# Patient Record
Sex: Female | Born: 1965
Health system: Southern US, Community
[De-identification: ages and names within clinical notes are randomized; demographics above are authoritative.]

---

## 1999-07-10 ENCOUNTER — Other Ambulatory Visit: Admission: RE | Admit: 1999-07-10 | Discharge: 1999-07-10 | Payer: Self-pay | Admitting: Obstetrics and Gynecology

## 2000-09-22 ENCOUNTER — Other Ambulatory Visit: Admission: RE | Admit: 2000-09-22 | Discharge: 2000-09-22 | Payer: Self-pay | Admitting: Obstetrics and Gynecology

## 2001-09-27 ENCOUNTER — Other Ambulatory Visit: Admission: RE | Admit: 2001-09-27 | Discharge: 2001-09-27 | Payer: Self-pay | Admitting: Obstetrics and Gynecology

## 2002-07-29 ENCOUNTER — Other Ambulatory Visit: Admission: RE | Admit: 2002-07-29 | Discharge: 2002-07-29 | Payer: Self-pay | Admitting: Obstetrics and Gynecology

## 2003-08-25 ENCOUNTER — Other Ambulatory Visit: Admission: RE | Admit: 2003-08-25 | Discharge: 2003-08-25 | Payer: Self-pay | Admitting: Obstetrics and Gynecology

## 2004-01-17 ENCOUNTER — Ambulatory Visit (HOSPITAL_COMMUNITY): Admission: RE | Admit: 2004-01-17 | Discharge: 2004-01-17 | Payer: Self-pay

## 2004-01-17 ENCOUNTER — Encounter (INDEPENDENT_AMBULATORY_CARE_PROVIDER_SITE_OTHER): Payer: Self-pay | Admitting: Specialist

## 2004-12-06 ENCOUNTER — Other Ambulatory Visit: Admission: RE | Admit: 2004-12-06 | Discharge: 2004-12-06 | Payer: Self-pay | Admitting: Obstetrics and Gynecology

## 2007-12-02 ENCOUNTER — Encounter (INDEPENDENT_AMBULATORY_CARE_PROVIDER_SITE_OTHER): Payer: Self-pay | Admitting: Obstetrics and Gynecology

## 2007-12-02 ENCOUNTER — Ambulatory Visit (HOSPITAL_COMMUNITY): Admission: RE | Admit: 2007-12-02 | Discharge: 2007-12-02 | Payer: Self-pay | Admitting: Obstetrics and Gynecology

## 2008-02-28 ENCOUNTER — Ambulatory Visit (HOSPITAL_COMMUNITY): Admission: RE | Admit: 2008-02-28 | Discharge: 2008-02-28 | Payer: Self-pay | Admitting: Urology

## 2009-02-26 ENCOUNTER — Encounter: Admission: RE | Admit: 2009-02-26 | Discharge: 2009-02-26 | Payer: Self-pay | Admitting: Obstetrics and Gynecology

## 2009-02-26 IMAGING — MG MM DIGITAL DIAGNOSTIC UNILAT R
2 series · 2 of 2 positions shown · non-contrast
Comparison: None

CLINICAL DATA: Abnormal baseline screening mammogram

DIGITAL DIAGNOSTIC RIGHT MAMMOGRAM

[R CC]
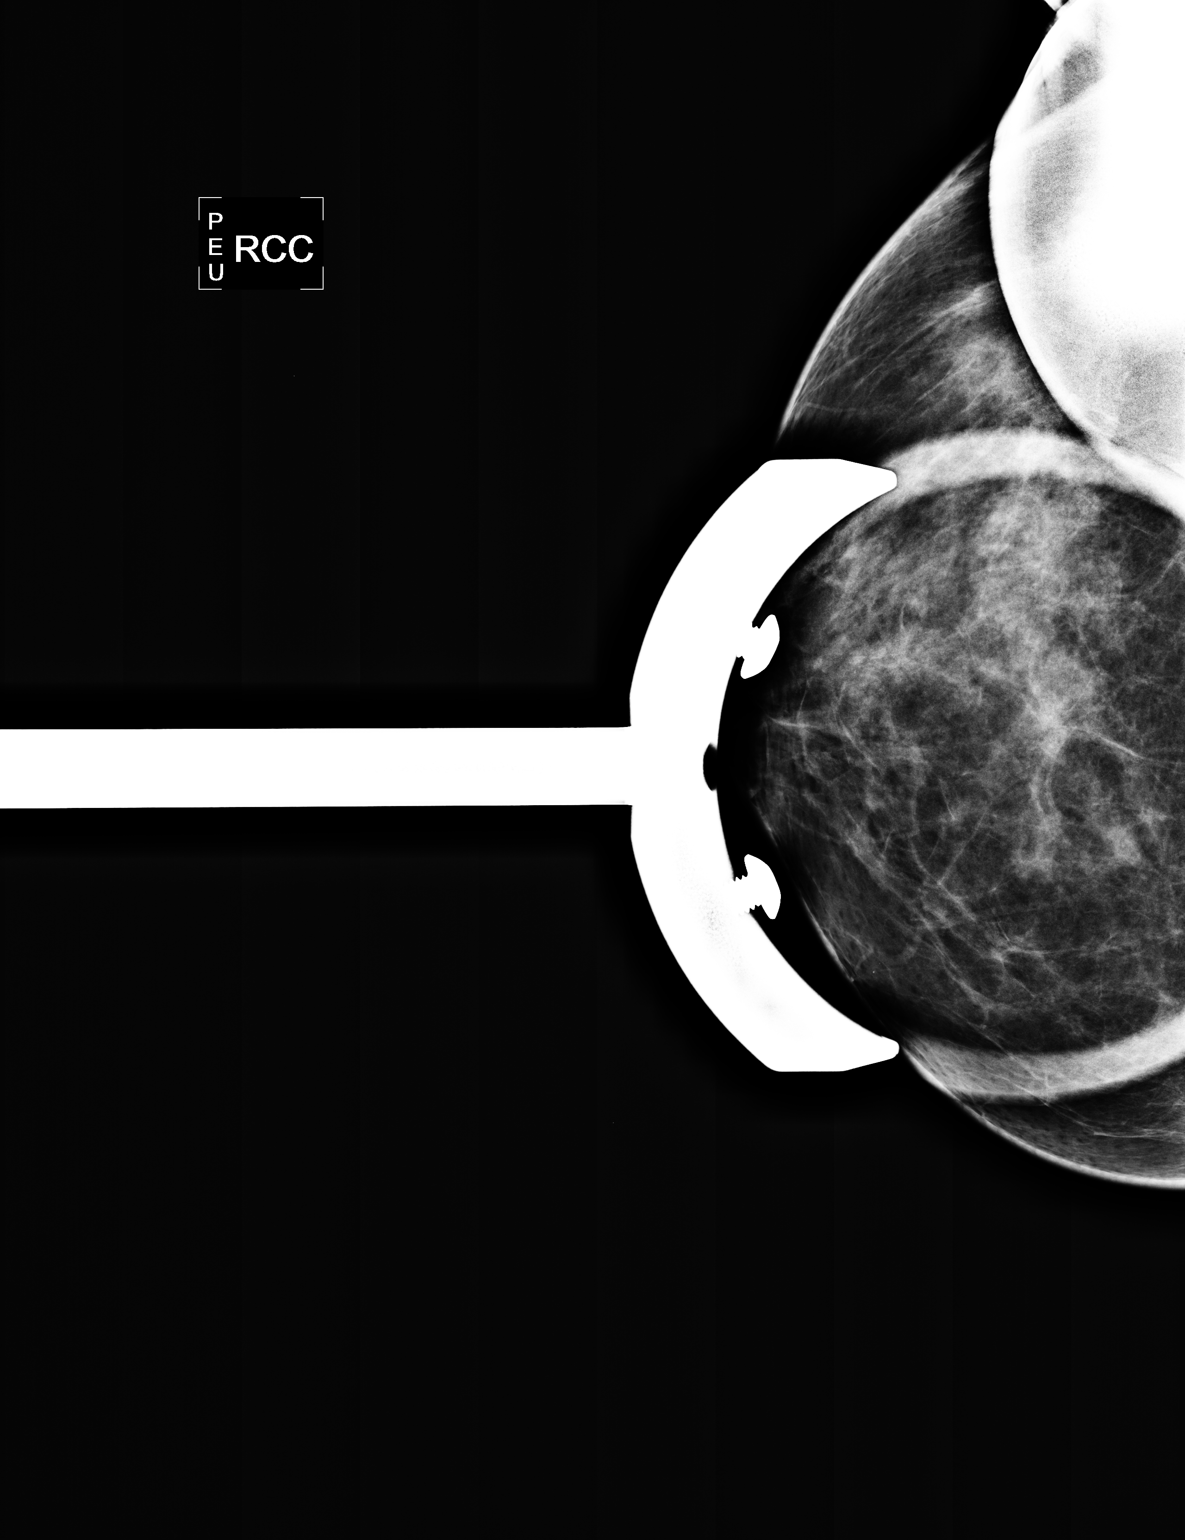

[R MLO]
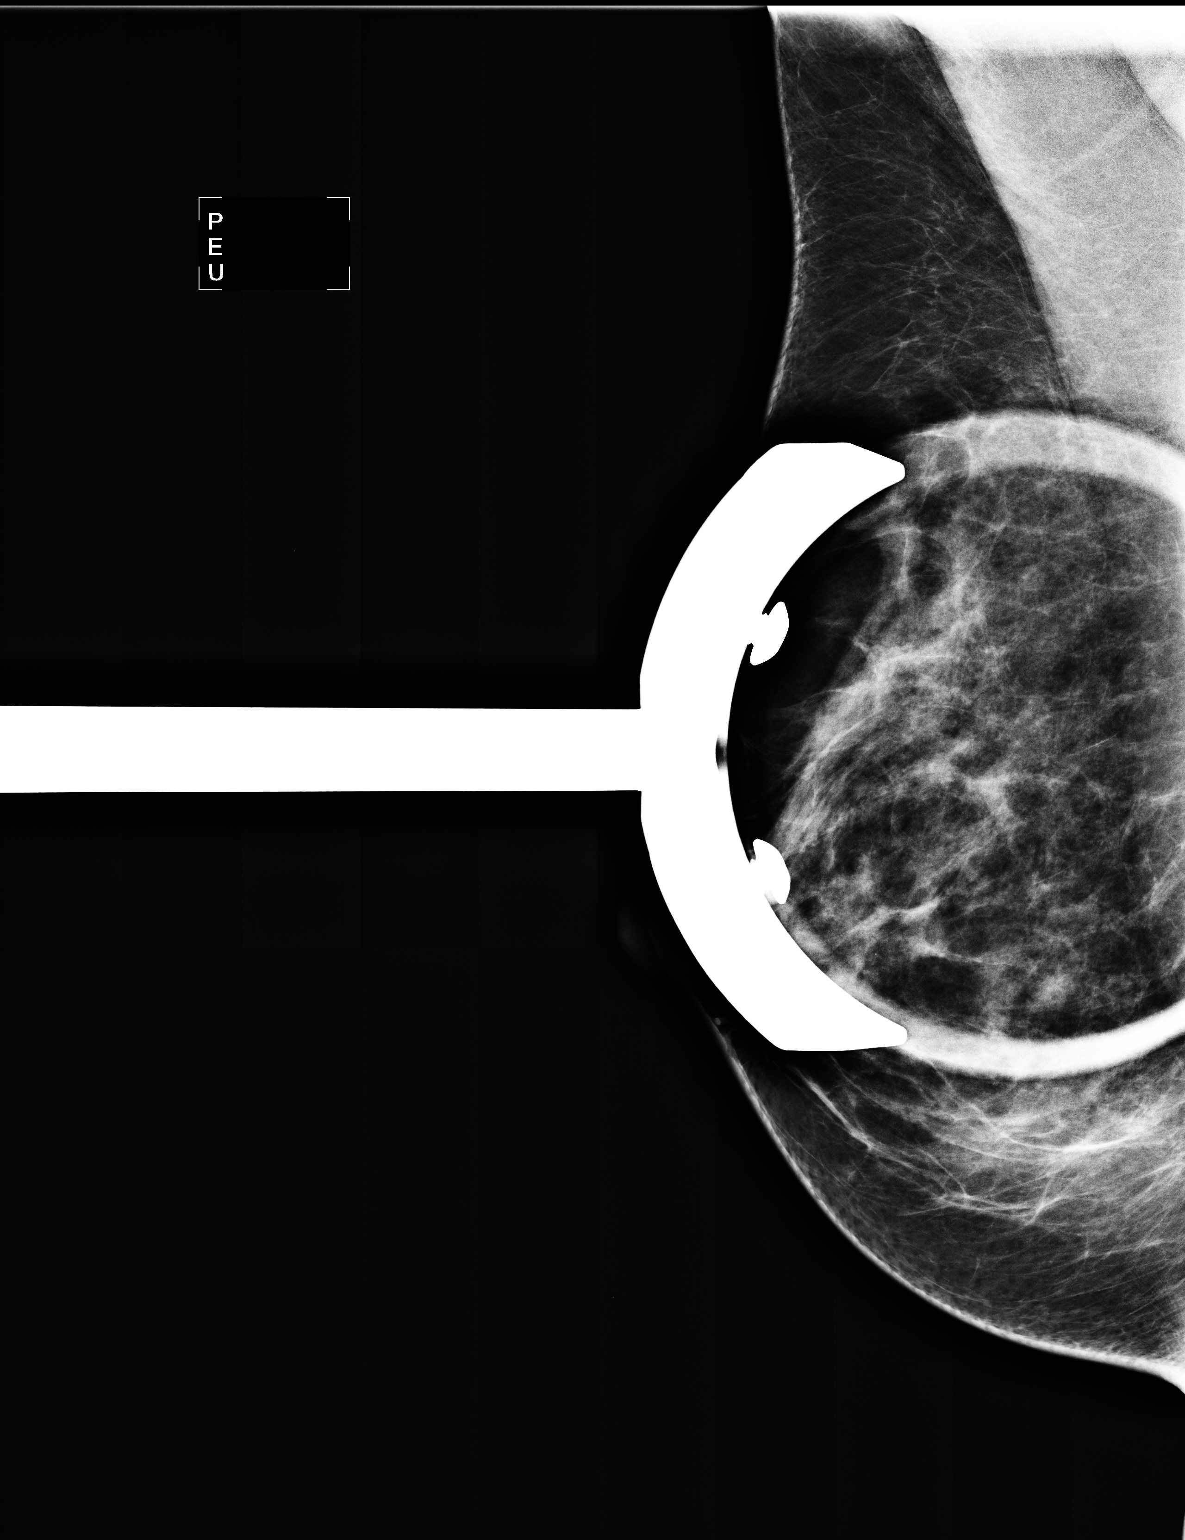

[2 of 2 positions shown; findings below may reference images not displayed]

FINDINGS: Additional views reveal no persistent mass or distortion
within the upper inner right breast.  The fibroglandular
parenchymal pattern is symmetric.
IMPRESSION: There is no radiographic evidence of  malignancy on the right.
Screening mammogram in 1 year recommended.

BI-RADS CATEGORY 1:  Negative.

## 2009-09-10 ENCOUNTER — Ambulatory Visit (HOSPITAL_COMMUNITY): Admission: RE | Admit: 2009-09-10 | Discharge: 2009-09-10 | Payer: Self-pay | Admitting: Urology

## 2010-08-06 NOTE — Op Note (Signed)
[name] [name]                ACCOUNT NO.:  [id number]   MEDICAL RECORD NO.:  [id number]          PATIENT TYPE:  AMB   LOCATION:  SDC                           FACILITY:  [facility]   PHYSICIAN:  Michelle L. Grewal, M.D.DATE OF BIRTH:  [birth year]   DATE OF PROCEDURE:  12/02/2007  DATE OF DISCHARGE:                               OPERATIVE REPORT   PREOPERATIVE DIAGNOSIS:  Endometriosis of the umbilicus.   POSTOPERATIVE DIAGNOSES:  1. Endometriosis of the umbilicus; moderate pelvic endometriosis      involving the left sidewall of the left ovary, old endometriosis      involving the right pelvic sidewall, and extensive endometriosis      involving the bladder flap.  2. Uterine fibroids.   PROCEDURES:  1. Removal of endometriotic nodule umbilicus.  2. Open laparoscopy.  3. Fulguration of endometriosis.   SURGEON:  Michelle L. Vincente Poli, MD   ANESTHESIA:  General.   FINDINGS:  Moderate endometriosis involving the umbilicus, bladder flap,  and ovaries.   SPECIMEN:  Umbilical nodule   DISPOSITION:  Sent to Pathology.   ESTIMATED BLOOD LOSS:  Minimal.   COMPLICATIONS:  None.   DRAINS:  None.   PROCEDURE:  The patient was taken to the operating room.  She was  intubated.  She was prepped and draped.  In-and-out catheter was used to  empty the bladder.  The uterine manipulator was inserted.  At this  point, attention was turned to the abdomen.  The umbilical nodule which  is deep in the umbilicus is about the size of a pebble.  I then grasped  the nodule with an Allis clamp.  I noted that there were 2 small areas,  they were opened and were draining of minimal amount of blood.  I then  took a scalpel and then made a circumferential incision around the  nodule.  It did appear that there was a small amount of endometrial  tissue within this area, was sent to Pathology.  I then extended the  incision down vertically below the umbilicus and then identified the  fascia on either side  with Allis clamps and opened the fascia and  peritoneum.  I then placed a Hasson scope and after I placed my angle  stitches using 0 Vicryl.  A pneumoperitoneum was performed.  The  laparoscope was introduced to the abdominal cavity.  A secondary  suprapubic trocar was placed under direct visualization using a 5-mm  trocar.  Exam of the abdomen and pelvis revealed the following.  She had  some uterine fibroids.  She had two that were subserosal on either side  of the uterine fundus.  Her fallopian tubes looked normal.  She had no  pelvic adhesions.  However, she had moderate endometriosis.  There was  some hemosiderin staining extensively on the bladder flap with some  powder burn lesions on the bladder flap, especially involving the area  near the fibroid on the right side.  She had some hemosiderin-stained  lesions that on the left sidewall and some doting of hemosiderin on her  left ovary.  I fulgurated  everything that I could safely using the  Klepinger.  The appendix appeared normal.  The cul-de-sac showed no  adhesions.  Her uterus, otherwise, looked fine.  Her right ovary looked  normal, but there looked to be like an old endometriotic lesion on the  right sidewall that did not need to be treated.  At the end of the  procedure, bleeding was not noted.  The pneumoperitoneum was released.  The trocars were removed.  The 0 Vicryl was tied down at the umbilical  incisions.  Her umbilicus was closed using 3-0 Vicryl and was nicely  reapproximated; however, it was not as deep, because I had to remove the  nodule.  Her suprapubic incision was closed using 2 interrupted using 3-  0 Vicryl.  The patient was extubated and went to recovery room in stable  condition.  All sponge, lap, and instrument counts were correct x2.      Michelle L. Vincente Poli, M.D.  Electronically Signed     MLG/MEDQ  D:  12/02/2007  T:  12/02/2007  Job:  [id number]

## 2010-08-09 NOTE — Op Note (Signed)
[name], [name]                ACCOUNT NO.:  [id number]   MEDICAL RECORD NO.:  [id number]          PATIENT TYPE:  AMB   LOCATION:  SDC                           FACILITY:  [facility]   PHYSICIAN:  Howard C. Mezer, M.D.  DATE OF BIRTH:  01/02/1966   DATE OF PROCEDURE:  01/17/2004  DATE OF DISCHARGE:                                 OPERATIVE REPORT   PREOPERATIVE DIAGNOSIS:  Endometrial polyp.   POSTOPERATIVE DIAGNOSIS:  Endometrial polyp.   OPERATION PERFORMED:  Hysteroscopy, dilatation and curettage, removal of  endometrial polyp.   SURGEON:  Leatha Gilding. Mezer, M.D.   ANESTHESIA:  MAC plus paracervical block.   PREPARATION:  Betadine.   The patient is a 45 year old female with a filling defect on  hysterosalpingogram and on a saline ultrasound, who is admitted for  hysteroscopy, D&C.   PROCEDURE:  With the patient in lithotomy position, prepped and draped in  routine fashion.  A paracervical block of 1% Xylocaine was placed and the  uterus sounded to 7.5 cm.  The cervix was very easily dilated.  The  hysteroscope was introduced and Hyskon was used as a distention medium.  The  endocervical canal appeared to be normal.  Upon entering the endometrial  cavity, a polyp was noted on the posterior proximal right wall of the  uterus.  The remainder of the cavity was essentially normal.  Both tubal  ostia were normal.  The staff had difficulty obtaining equipment so that the  polyp would be removed under direct vision, and the hysteroscope was  removed, and the cavity was sharply curetted, knowing exactly where the  polyp was located and was removed intact.  There was a small to moderate  amount of additional tissue curetted  Randall stone forceps were introduced  and no additional tissue was identified.  There was minimal bleeding at the  end of the procedure except from the tenaculum  site, which was cauterized with silver nitrate sticks.  The estimated blood  loss was less than 25 mL.   The sponge, instrument, and needle counts were  correct.  The patient tolerated the procedure well and was taken to the  recovery room in satisfactory condition.      HCM/MEDQ  D:  01/17/2004  T:  01/17/2004  Job:  [id number]   cc:   [name], M.D.  [address] [place]  [place] [address]  Fax: [phone]

## 2010-12-25 LAB — CBC
Hemoglobin: 14.6
MCHC: 34.1
Platelets: 203
WBC: 7.7

## 2011-09-15 ENCOUNTER — Other Ambulatory Visit (HOSPITAL_COMMUNITY): Payer: Self-pay | Admitting: Urology

## 2011-09-15 DIAGNOSIS — D49519 Neoplasm of unspecified behavior of unspecified kidney: Secondary | ICD-10-CM

## 2011-09-29 ENCOUNTER — Ambulatory Visit (HOSPITAL_COMMUNITY)
Admission: RE | Admit: 2011-09-29 | Discharge: 2011-09-29 | Disposition: A | Discharge status: Home / Self Care | Payer: 59 | Source: Ambulatory Visit | Attending: Urology | Admitting: Urology

## 2011-09-29 DIAGNOSIS — D4959 Neoplasm of unspecified behavior of other genitourinary organ: Secondary | ICD-10-CM | POA: Insufficient documentation

## 2011-09-29 DIAGNOSIS — D49519 Neoplasm of unspecified behavior of unspecified kidney: Secondary | ICD-10-CM

## 2011-09-29 MED ORDER — GADOBENATE DIMEGLUMINE 529 MG/ML IV SOLN
11.0000 mL | Freq: Once | INTRAVENOUS | Status: AC | PRN
  Administered 2011-09-29: 11 mL via INTRAVENOUS

## 2012-05-17 ENCOUNTER — Other Ambulatory Visit: Payer: Self-pay | Admitting: Obstetrics and Gynecology

## 2013-05-23 ENCOUNTER — Other Ambulatory Visit: Payer: Self-pay | Admitting: Obstetrics and Gynecology

## 2014-06-12 ENCOUNTER — Other Ambulatory Visit: Payer: Self-pay | Admitting: Obstetrics and Gynecology

## 2014-06-13 LAB — CYTOLOGY - PAP

## 2015-04-20 DIAGNOSIS — D485 Neoplasm of uncertain behavior of skin: Secondary | ICD-10-CM | POA: Diagnosis not present

## 2015-04-20 DIAGNOSIS — D2272 Melanocytic nevi of left lower limb, including hip: Secondary | ICD-10-CM | POA: Diagnosis not present

## 2015-04-20 DIAGNOSIS — L308 Other specified dermatitis: Secondary | ICD-10-CM | POA: Diagnosis not present

## 2015-04-20 DIAGNOSIS — L814 Other melanin hyperpigmentation: Secondary | ICD-10-CM | POA: Diagnosis not present

## 2015-04-20 DIAGNOSIS — L700 Acne vulgaris: Secondary | ICD-10-CM | POA: Diagnosis not present

## 2015-04-20 DIAGNOSIS — D2271 Melanocytic nevi of right lower limb, including hip: Secondary | ICD-10-CM | POA: Diagnosis not present

## 2015-04-20 DIAGNOSIS — D225 Melanocytic nevi of trunk: Secondary | ICD-10-CM | POA: Diagnosis not present

## 2015-04-20 DIAGNOSIS — L821 Other seborrheic keratosis: Secondary | ICD-10-CM | POA: Diagnosis not present

## 2015-04-20 DIAGNOSIS — D1801 Hemangioma of skin and subcutaneous tissue: Secondary | ICD-10-CM | POA: Diagnosis not present

## 2015-04-20 DIAGNOSIS — D224 Melanocytic nevi of scalp and neck: Secondary | ICD-10-CM | POA: Diagnosis not present

## 2015-06-04 DIAGNOSIS — Z0000 Encounter for general adult medical examination without abnormal findings: Secondary | ICD-10-CM | POA: Diagnosis not present

## 2015-06-04 DIAGNOSIS — R5383 Other fatigue: Secondary | ICD-10-CM | POA: Diagnosis not present

## 2015-06-04 MED FILL — FLUCONAZOLE 150 MG TABLET: 150 | 4 days supply | Qty: 2 | Fill #0

## 2015-06-11 DIAGNOSIS — R5383 Other fatigue: Secondary | ICD-10-CM | POA: Diagnosis not present

## 2015-06-11 DIAGNOSIS — Z13220 Encounter for screening for lipoid disorders: Secondary | ICD-10-CM | POA: Diagnosis not present

## 2015-06-11 DIAGNOSIS — Z0000 Encounter for general adult medical examination without abnormal findings: Secondary | ICD-10-CM | POA: Diagnosis not present

## 2015-06-13 MED FILL — AMETHYST 90-20 MCG TABLET: 90-20 | 28 days supply | Qty: 28 | Fill #0

## 2015-06-14 MED FILL — VIT D2 1.25 MG (50,000 UNIT: 1.25 MG | 84 days supply | Qty: 24 | Fill #0

## 2015-07-12 MED FILL — AMETHYST 90-20 MCG TABLET: 90-20 | 28 days supply | Qty: 28 | Fill #0

## 2015-07-30 DIAGNOSIS — Z6821 Body mass index (BMI) 21.0-21.9, adult: Secondary | ICD-10-CM | POA: Diagnosis not present

## 2015-07-30 DIAGNOSIS — Z1231 Encounter for screening mammogram for malignant neoplasm of breast: Secondary | ICD-10-CM | POA: Diagnosis not present

## 2015-07-30 DIAGNOSIS — Z01419 Encounter for gynecological examination (general) (routine) without abnormal findings: Secondary | ICD-10-CM | POA: Diagnosis not present

## 2015-08-02 MED FILL — FLUCONAZOLE 150 MG TABLET: 150 | 1 days supply | Qty: 1 | Fill #0

## 2015-08-13 MED FILL — AMETHYST 90-20 MCG TABLET: 90-20 | 84 days supply | Qty: 84 | Fill #0

## 2015-08-16 DIAGNOSIS — S0501XA Injury of conjunctiva and corneal abrasion without foreign body, right eye, initial encounter: Secondary | ICD-10-CM | POA: Diagnosis not present

## 2015-08-16 MED FILL — CIPROFLOXACIN 0.3% EYE DROP: 0.3 | 7 days supply | Qty: 5 | Fill #0

## 2015-10-31 MED FILL — AMETHYST 90-20 MCG TABLET: 90-20 | 84 days supply | Qty: 84 | Fill #1

## 2016-01-07 DIAGNOSIS — Z0100 Encounter for examination of eyes and vision without abnormal findings: Secondary | ICD-10-CM | POA: Diagnosis not present

## 2016-01-21 DIAGNOSIS — B373 Candidiasis of vulva and vagina: Secondary | ICD-10-CM | POA: Diagnosis not present

## 2016-01-21 DIAGNOSIS — R102 Pelvic and perineal pain: Secondary | ICD-10-CM | POA: Diagnosis not present

## 2016-01-21 MED FILL — FLUCONAZOLE 150 MG TABLET: 150 | 3 days supply | Qty: 2 | Fill #0

## 2016-01-21 MED FILL — AMETHYST 90-20 MCG TABLET: 90-20 | 84 days supply | Qty: 84 | Fill #2

## 2016-04-07 DIAGNOSIS — N390 Urinary tract infection, site not specified: Secondary | ICD-10-CM | POA: Diagnosis not present

## 2016-04-07 DIAGNOSIS — N760 Acute vaginitis: Secondary | ICD-10-CM | POA: Diagnosis not present

## 2016-04-08 MED FILL — NITROFURANTOIN MONO-MCR 100: 100 | 7 days supply | Qty: 14 | Fill #0

## 2016-04-15 MED FILL — AMETHYST 90-20 MCG TABLET: 90-20 | 84 days supply | Qty: 84 | Fill #3

## 2016-04-22 DIAGNOSIS — L918 Other hypertrophic disorders of the skin: Secondary | ICD-10-CM | POA: Diagnosis not present

## 2016-04-22 DIAGNOSIS — D225 Melanocytic nevi of trunk: Secondary | ICD-10-CM | POA: Diagnosis not present

## 2016-04-22 DIAGNOSIS — L240 Irritant contact dermatitis due to detergents: Secondary | ICD-10-CM | POA: Diagnosis not present

## 2016-04-22 DIAGNOSIS — L853 Xerosis cutis: Secondary | ICD-10-CM | POA: Diagnosis not present

## 2016-04-22 DIAGNOSIS — D2272 Melanocytic nevi of left lower limb, including hip: Secondary | ICD-10-CM | POA: Diagnosis not present

## 2016-04-22 DIAGNOSIS — L814 Other melanin hyperpigmentation: Secondary | ICD-10-CM | POA: Diagnosis not present

## 2016-04-22 DIAGNOSIS — D2239 Melanocytic nevi of other parts of face: Secondary | ICD-10-CM | POA: Diagnosis not present

## 2016-05-21 DIAGNOSIS — D49512 Neoplasm of unspecified behavior of left kidney: Secondary | ICD-10-CM | POA: Diagnosis not present

## 2016-05-23 ENCOUNTER — Other Ambulatory Visit (HOSPITAL_COMMUNITY): Payer: Self-pay | Admitting: Urology

## 2016-05-23 DIAGNOSIS — D4102 Neoplasm of uncertain behavior of left kidney: Secondary | ICD-10-CM

## 2016-06-02 ENCOUNTER — Ambulatory Visit (HOSPITAL_COMMUNITY)
Admission: RE | Admit: 2016-06-02 | Discharge: 2016-06-02 | Disposition: A | Discharge status: Home / Self Care | Payer: 59 | Source: Ambulatory Visit | Attending: Urology | Admitting: Urology

## 2016-06-02 DIAGNOSIS — N2889 Other specified disorders of kidney and ureter: Secondary | ICD-10-CM | POA: Diagnosis not present

## 2016-06-02 DIAGNOSIS — D4102 Neoplasm of uncertain behavior of left kidney: Secondary | ICD-10-CM

## 2016-06-02 MED ORDER — GADOBENATE DIMEGLUMINE 529 MG/ML IV SOLN
15.0000 mL | Freq: Once | INTRAVENOUS | Status: AC | PRN
  Administered 2016-06-02: 12 mL via INTRAVENOUS

## 2016-07-03 MED FILL — AMETHYST 90-20 MCG TABLET: 90-20 | 84 days supply | Qty: 84 | Fill #4

## 2016-07-07 MED FILL — ADAPALENE 0.1% CRM 45GM: 0.1 | 30 days supply | Qty: 45 | Fill #0

## 2016-08-25 DIAGNOSIS — Z6821 Body mass index (BMI) 21.0-21.9, adult: Secondary | ICD-10-CM | POA: Diagnosis not present

## 2016-08-25 DIAGNOSIS — Z1231 Encounter for screening mammogram for malignant neoplasm of breast: Secondary | ICD-10-CM | POA: Diagnosis not present

## 2016-08-25 DIAGNOSIS — Z01419 Encounter for gynecological examination (general) (routine) without abnormal findings: Secondary | ICD-10-CM | POA: Diagnosis not present

## 2016-08-27 MED FILL — FLUCONAZOLE 150 MG TABLET: 150 | 1 days supply | Qty: 1 | Fill #0

## 2016-09-01 MED FILL — FLUCONAZOLE 150 MG TABLET: 150 | 1 days supply | Qty: 1 | Fill #0

## 2016-09-25 MED FILL — AMETHYST 90-20 MCG TABLET: 90-20 | 84 days supply | Qty: 84 | Fill #0

## 2016-12-19 MED FILL — AMETHYST 90-20 MCG TABLET: 90-20 | 84 days supply | Qty: 84 | Fill #1

## 2017-03-09 MED FILL — AMETHYST 90-20 MCG TABLET: 90-20 | 84 days supply | Qty: 84 | Fill #2

## 2017-03-14 DIAGNOSIS — H00024 Hordeolum internum left upper eyelid: Secondary | ICD-10-CM | POA: Diagnosis not present

## 2017-05-19 MED FILL — ADAPALENE 0.1% CREAM: 0.1 | 20 days supply | Qty: 45 | Fill #0

## 2017-05-29 MED FILL — LEVONOR-ETH ESTRA 0.09-0.02: 90-20 | 84 days supply | Qty: 84 | Fill #3

## 2017-08-24 MED FILL — LEVONOR-ETH ESTRA 0.09-0.02: 90-20 | 84 days supply | Qty: 84 | Fill #4

## 2017-11-09 MED FILL — LEVONOR-ETH ESTRA 0.09-0.02: 90-20 | 84 days supply | Qty: 84 | Fill #0

## 2017-11-30 MED FILL — CLINDAMYCIN HCL 150 MG CAPS: 150 | 1 days supply | Qty: 4 | Fill #0

## 2017-11-30 MED FILL — SM BLOOD PRESSURE MONITOR: 20 days supply | Qty: 1 | Fill #0

## 2017-12-11 MED FILL — AMOXICILLIN 500 MG CAPSULE: 500 | 5 days supply | Qty: 15 | Fill #0

## 2017-12-11 MED FILL — HYDROCODON-APAP 10-325: 10-325 | 2 days supply | Qty: 15 | Fill #0

## 2018-01-29 MED FILL — LEVONOR-ETH ESTRA 0.09-0.02: 90-20 | 84 days supply | Qty: 84 | Fill #1

## 2018-04-26 MED FILL — LEVONOR-ETH ESTRA 0.09-0.02: 90-20 | 84 days supply | Qty: 84 | Fill #2

## 2018-05-31 MED FILL — CARISOPRODOL 350 MG TABS: 350 | 5 days supply | Qty: 20 | Fill #0

## 2018-05-31 MED FILL — MELOXICAM 7.5 MG TABLET: 7.5 | 30 days supply | Qty: 30 | Fill #0

## 2018-07-12 MED FILL — LEVONOR-ETH ESTRA 0.09-0.02: 90-20 | 84 days supply | Qty: 84 | Fill #3

## 2018-10-06 MED FILL — LEVONOR-ETH ESTRA 0.09-0.02: 90-20 | 84 days supply | Qty: 84 | Fill #0

## 2018-12-13 MED FILL — LEVONOR-ETH ESTRA 0.09-0.02: 90-20 | 84 days supply | Qty: 84 | Fill #0

## 2019-03-21 MED FILL — LEVONOR-ETH ESTRA 0.09-0.02: 90-20 | 84 days supply | Qty: 84 | Fill #1

## 2019-06-08 MED FILL — LEVONOR-ETH ESTRA 0.09-0.02: 90-20 | 84 days supply | Qty: 84 | Fill #2

## 2019-06-28 MED FILL — DAPSONE 5% GEL: 5 | 30 days supply | Qty: 60 | Fill #0

## 2019-07-18 ENCOUNTER — Other Ambulatory Visit: Payer: Self-pay | Admitting: Neurology

## 2019-07-18 DIAGNOSIS — R4789 Other speech disturbances: Secondary | ICD-10-CM

## 2019-11-16 MED FILL — LEVONOR-ETH ESTRA 0.09-0.02: 90-20 | 84 days supply | Qty: 84 | Fill #4

## 2020-01-09 ENCOUNTER — Other Ambulatory Visit (HOSPITAL_COMMUNITY): Payer: Self-pay | Admitting: Radiology

## 2020-02-08 MED FILL — LEVONOR-ETH ESTRA 0.09-0.02: 90-20 | 84 days supply | Qty: 84 | Fill #0

## 2020-03-19 ENCOUNTER — Other Ambulatory Visit (HOSPITAL_COMMUNITY): Payer: Self-pay | Admitting: Gastroenterology

## 2020-04-28 MED FILL — LEVONOR-ETH ESTRA 0.09-0.02: 90-20 | 84 days supply | Qty: 84 | Fill #1

## 2020-05-21 MED FILL — PEG-3350 SOLUTION: 420 | 1 days supply | Qty: 4000 | Fill #0

## 2020-07-19 MED FILL — Levonorgestrel-Ethinyl Estradiol (Continuous) Tab 90-20 MCG: ORAL | 84 days supply | Qty: 84 | Fill #0 | Status: AC

## 2020-07-20 ENCOUNTER — Other Ambulatory Visit (HOSPITAL_COMMUNITY): Payer: Self-pay

## 2020-07-21 ENCOUNTER — Other Ambulatory Visit (HOSPITAL_COMMUNITY): Payer: Self-pay

## 2020-07-23 ENCOUNTER — Other Ambulatory Visit (HOSPITAL_COMMUNITY): Payer: Self-pay

## 2020-10-12 ENCOUNTER — Other Ambulatory Visit (HOSPITAL_COMMUNITY): Payer: Self-pay

## 2020-10-12 MED FILL — Levonorgestrel-Ethinyl Estradiol (Continuous) Tab 90-20 MCG: ORAL | 84 days supply | Qty: 84 | Fill #1 | Status: AC

## 2020-10-13 ENCOUNTER — Other Ambulatory Visit (HOSPITAL_COMMUNITY): Payer: Self-pay

## 2020-10-15 ENCOUNTER — Other Ambulatory Visit (HOSPITAL_COMMUNITY): Payer: Self-pay

## 2020-12-26 MED FILL — Levonorgestrel-Ethinyl Estradiol (Continuous) Tab 90-20 MCG: ORAL | 84 days supply | Qty: 84 | Fill #2 | Status: AC

## 2020-12-28 ENCOUNTER — Other Ambulatory Visit (HOSPITAL_COMMUNITY): Payer: Self-pay

## 2021-02-08 ENCOUNTER — Other Ambulatory Visit: Payer: Self-pay

## 2021-02-08 ENCOUNTER — Ambulatory Visit: Payer: Self-pay | Attending: Internal Medicine

## 2021-02-08 ENCOUNTER — Other Ambulatory Visit (HOSPITAL_BASED_OUTPATIENT_CLINIC_OR_DEPARTMENT_OTHER): Payer: Self-pay

## 2021-02-08 DIAGNOSIS — Z23 Encounter for immunization: Secondary | ICD-10-CM

## 2021-02-08 MED ORDER — PFIZER COVID-19 VAC BIVALENT 30 MCG/0.3ML IM SUSP
INTRAMUSCULAR | 0 refills | Status: AC
  Filled 2021-02-08: qty 0.3, 1d supply, fill #0

## 2021-02-08 NOTE — Progress Notes (Signed)
   Covid-19 Vaccination Clinic  Name:  [name] [name]    MRN: [id number] DOB: January 09, 1966  02/08/2021  [name] was observed post Covid-19 immunization for 15 minutes without incident. She was provided with Vaccine Information Sheet and instruction to access the V-Safe system.   [name] was instructed to call 911 with any severe reactions post vaccine: Difficulty breathing  Swelling of face and throat  A fast heartbeat  A bad rash all over body  Dizziness and weakness   Immunizations Administered     Name Date Dose VIS Date Route   Pfizer Covid-19 Vaccine Bivalent Booster 02/08/2021  3:42 PM 0.3 mL 11/21/2020 Intramuscular   Manufacturer: [place]   Lot: [id number]   [place]: [phone]

## 2021-03-14 ENCOUNTER — Other Ambulatory Visit (HOSPITAL_COMMUNITY): Payer: Self-pay

## 2021-03-14 MED ORDER — LEVONORGESTREL-ETHINYL ESTRAD 90-20 MCG PO TABS
1.0000 | ORAL_TABLET | Freq: Every day | ORAL | 4 refills | Status: AC
  Filled 2021-03-14: qty 84, 84d supply, fill #0
  Filled 2021-06-11: qty 84, 84d supply, fill #1

## 2021-03-15 ENCOUNTER — Other Ambulatory Visit (HOSPITAL_COMMUNITY): Payer: Self-pay

## 2021-03-19 ENCOUNTER — Other Ambulatory Visit (HOSPITAL_COMMUNITY): Payer: Self-pay

## 2021-06-12 ENCOUNTER — Other Ambulatory Visit (HOSPITAL_COMMUNITY): Payer: Self-pay

## 2021-06-13 ENCOUNTER — Other Ambulatory Visit (HOSPITAL_COMMUNITY): Payer: Self-pay

## 2021-10-23 ENCOUNTER — Other Ambulatory Visit (HOSPITAL_COMMUNITY): Payer: Self-pay

## 2021-10-23 MED ORDER — LEVONORGESTREL-ETHINYL ESTRAD 90-20 MCG PO TABS
1.0000 | ORAL_TABLET | Freq: Every day | ORAL | 4 refills | Status: AC
  Filled 2021-10-23: qty 28, 28d supply, fill #0
  Filled 2021-10-25: qty 56, 56d supply, fill #0
  Filled 2022-01-29: qty 84, 84d supply, fill #1
  Filled 2022-04-25: qty 84, 84d supply, fill #2

## 2021-10-24 ENCOUNTER — Other Ambulatory Visit (HOSPITAL_COMMUNITY): Payer: Self-pay

## 2021-10-25 ENCOUNTER — Other Ambulatory Visit (HOSPITAL_COMMUNITY): Payer: Self-pay

## 2021-10-29 ENCOUNTER — Other Ambulatory Visit (HOSPITAL_COMMUNITY): Payer: Self-pay

## 2021-10-29 MED ORDER — MUPIROCIN 2 % EX OINT
TOPICAL_OINTMENT | CUTANEOUS | 0 refills | Status: AC
  Filled 2021-10-29: qty 22, 15d supply, fill #0

## 2021-12-20 ENCOUNTER — Other Ambulatory Visit (HOSPITAL_COMMUNITY): Payer: Self-pay

## 2021-12-20 MED ORDER — CARISOPRODOL 350 MG PO TABS
350.0000 mg | ORAL_TABLET | Freq: Four times a day (QID) | ORAL | 0 refills | Status: AC | PRN
  Filled 2021-12-20: qty 20, 5d supply, fill #0

## 2022-01-30 ENCOUNTER — Other Ambulatory Visit (HOSPITAL_COMMUNITY): Payer: Self-pay

## 2022-01-31 ENCOUNTER — Other Ambulatory Visit (HOSPITAL_COMMUNITY): Payer: Self-pay

## 2022-02-01 ENCOUNTER — Other Ambulatory Visit (HOSPITAL_COMMUNITY): Payer: Self-pay

## 2022-04-25 ENCOUNTER — Other Ambulatory Visit (HOSPITAL_COMMUNITY): Payer: Self-pay

## 2022-04-28 ENCOUNTER — Other Ambulatory Visit (HOSPITAL_COMMUNITY): Payer: Self-pay

## 2022-07-04 ENCOUNTER — Other Ambulatory Visit (HOSPITAL_COMMUNITY): Payer: Self-pay

## 2022-07-04 DIAGNOSIS — Z6822 Body mass index (BMI) 22.0-22.9, adult: Secondary | ICD-10-CM | POA: Diagnosis not present

## 2022-07-04 DIAGNOSIS — R319 Hematuria, unspecified: Secondary | ICD-10-CM | POA: Diagnosis not present

## 2022-07-04 DIAGNOSIS — Z01419 Encounter for gynecological examination (general) (routine) without abnormal findings: Secondary | ICD-10-CM | POA: Diagnosis not present

## 2022-07-04 DIAGNOSIS — Z1231 Encounter for screening mammogram for malignant neoplasm of breast: Secondary | ICD-10-CM | POA: Diagnosis not present

## 2022-07-04 MED ORDER — LEVONORGESTREL-ETHINYL ESTRAD 90-20 MCG PO TABS
1.0000 | ORAL_TABLET | Freq: Every day | ORAL | 4 refills | Status: DC
  Filled 2022-07-04: qty 112, 84d supply, fill #0
  Filled 2022-11-02 – 2022-11-05 (×2): qty 56, 56d supply, fill #1
  Filled 2022-11-05: qty 84, 84d supply, fill #1
  Filled 2023-01-23: qty 84, 84d supply, fill #2
  Filled 2023-04-10: qty 84, 84d supply, fill #3

## 2022-07-07 ENCOUNTER — Other Ambulatory Visit (HOSPITAL_COMMUNITY): Payer: Self-pay

## 2022-07-11 ENCOUNTER — Other Ambulatory Visit (HOSPITAL_COMMUNITY): Payer: Self-pay

## 2022-09-10 DIAGNOSIS — D2272 Melanocytic nevi of left lower limb, including hip: Secondary | ICD-10-CM | POA: Diagnosis not present

## 2022-09-10 DIAGNOSIS — L700 Acne vulgaris: Secondary | ICD-10-CM | POA: Diagnosis not present

## 2022-09-10 DIAGNOSIS — D1801 Hemangioma of skin and subcutaneous tissue: Secondary | ICD-10-CM | POA: Diagnosis not present

## 2022-09-10 DIAGNOSIS — L814 Other melanin hyperpigmentation: Secondary | ICD-10-CM | POA: Diagnosis not present

## 2022-09-10 DIAGNOSIS — D2239 Melanocytic nevi of other parts of face: Secondary | ICD-10-CM | POA: Diagnosis not present

## 2022-09-10 DIAGNOSIS — D225 Melanocytic nevi of trunk: Secondary | ICD-10-CM | POA: Diagnosis not present

## 2022-09-10 DIAGNOSIS — L918 Other hypertrophic disorders of the skin: Secondary | ICD-10-CM | POA: Diagnosis not present

## 2022-09-10 DIAGNOSIS — D2262 Melanocytic nevi of left upper limb, including shoulder: Secondary | ICD-10-CM | POA: Diagnosis not present

## 2022-09-10 DIAGNOSIS — L821 Other seborrheic keratosis: Secondary | ICD-10-CM | POA: Diagnosis not present

## 2022-09-10 DIAGNOSIS — D2261 Melanocytic nevi of right upper limb, including shoulder: Secondary | ICD-10-CM | POA: Diagnosis not present

## 2022-10-24 DIAGNOSIS — Z6822 Body mass index (BMI) 22.0-22.9, adult: Secondary | ICD-10-CM | POA: Diagnosis not present

## 2022-10-24 DIAGNOSIS — Z0000 Encounter for general adult medical examination without abnormal findings: Secondary | ICD-10-CM | POA: Diagnosis not present

## 2022-10-24 DIAGNOSIS — E559 Vitamin D deficiency, unspecified: Secondary | ICD-10-CM | POA: Diagnosis not present

## 2022-10-24 DIAGNOSIS — H00014 Hordeolum externum left upper eyelid: Secondary | ICD-10-CM | POA: Diagnosis not present

## 2022-10-24 DIAGNOSIS — J309 Allergic rhinitis, unspecified: Secondary | ICD-10-CM | POA: Diagnosis not present

## 2022-10-24 DIAGNOSIS — N809 Endometriosis, unspecified: Secondary | ICD-10-CM | POA: Diagnosis not present

## 2022-10-24 DIAGNOSIS — M8580 Other specified disorders of bone density and structure, unspecified site: Secondary | ICD-10-CM | POA: Diagnosis not present

## 2022-11-03 ENCOUNTER — Other Ambulatory Visit: Payer: Self-pay

## 2022-11-03 ENCOUNTER — Other Ambulatory Visit (HOSPITAL_COMMUNITY): Payer: Self-pay

## 2022-11-04 ENCOUNTER — Other Ambulatory Visit: Payer: Self-pay

## 2022-11-04 ENCOUNTER — Other Ambulatory Visit (HOSPITAL_COMMUNITY): Payer: Self-pay

## 2022-11-05 ENCOUNTER — Other Ambulatory Visit (HOSPITAL_COMMUNITY): Payer: Self-pay

## 2022-11-05 ENCOUNTER — Other Ambulatory Visit: Payer: Self-pay

## 2023-01-20 ENCOUNTER — Ambulatory Visit
Admission: RE | Admit: 2023-01-20 | Discharge: 2023-01-20 | Disposition: A | Discharge status: Home / Self Care | Payer: 59 | Source: Ambulatory Visit

## 2023-01-20 VITALS — BP 139/90 | HR 66 | Temp 97.8°F | Resp 18

## 2023-01-20 DIAGNOSIS — H6012 Cellulitis of left external ear: Secondary | ICD-10-CM | POA: Diagnosis not present

## 2023-01-20 MED ORDER — CEPHALEXIN 500 MG PO CAPS: 500.0000 mg | ORAL_CAPSULE | Freq: Two times a day (BID) | ORAL | 0 refills | Status: AC

## 2023-01-20 NOTE — ED Provider Notes (Signed)
[facility]    CSN: [id number] Arrival date & time: 01/20/23  1704      History   Chief Complaint Chief Complaint  Patient presents with   Ear Injury    Possible infection in outer ear from pimple/abrasion. - Entered by patient    HPI [name] [name] is a 57 y.o. female.   HPI Pleasant 57 year old female is concerned with possible infection of left external ear.  Patient reports she had a small pimple on outer ear that popped last night.  Patient reports noticing redness, warmth and tenderness to left outer ear upon arising today and has been using OTC mupirocin ointment  History reviewed. No pertinent past medical history.  There are no problems to display for this patient.   History reviewed. No pertinent surgical history.  OB History   No obstetric history on file.      Home Medications    Prior to Admission medications   Medication Sig Start Date End Date Taking? Authorizing Provider  cephALEXin (KEFLEX) 500 MG capsule Take 1 capsule (500 mg total) by mouth 2 (two) times daily for 7 days. 01/20/23 01/27/23 Yes Trevor Iha, FNP  fexofenadine (ALLEGRA) 180 MG tablet Take 180 mg by mouth daily.   Yes [provider]  levonorgestrel-ethinyl estradiol (LYBREL) 90-20 MCG tablet Take 1 tablet by mouth daily. 07/04/22  Yes   mupirocin ointment (BACTROBAN) 2 % Apply  a small amount to affected area once a day 10/29/21  Yes   carisoprodol (SOMA) 350 MG tablet Take 1 tablet (350 mg total) by mouth every 6 (six) hours as needed for muscle spasms 11/14/21     COVID-19 mRNA bivalent vaccine, Pfizer, (PFIZER COVID-19 VAC BIVALENT) injection Inject into the muscle. 02/08/21   Judyann Munson, MD  levonorgestrel-ethinyl estradiol (LYBREL) 90-20 MCG tablet TAKE 1 TABLET BY MOUTH DAILY 03/14/21     levonorgestrel-ethinyl estradiol (LYBREL) 90-20 MCG tablet TAKE 1 TABLET BY MOUTH DAILY 10/23/21       Family History History reviewed. No pertinent family  history.  Social History     Allergies   Latex and Doxycycline   Review of Systems Review of Systems  HENT:  Positive for ear pain.   Skin:  Positive for rash.  All other systems reviewed and are negative.    Physical Exam Triage Vital Signs ED Triage Vitals  Encounter Vitals Group     BP      Systolic BP Percentile      Diastolic BP Percentile      Pulse      Resp      Temp      Temp src      SpO2      Weight      Height      Head Circumference      Peak Flow      Pain Score      Pain Loc      Pain Education      Exclude from Growth Chart    No data found.  Updated Vital Signs BP (!) 139/90 (BP Location: Right Arm)   Pulse 66   Temp 97.8 F (36.6 C) (Oral)   Resp 18   SpO2 100%     Physical Exam Vitals and nursing note reviewed.  Constitutional:      Appearance: Normal appearance.  HENT:     Head: Normocephalic and atraumatic.     Right Ear: Tympanic membrane, ear canal and external ear normal.  Left Ear: Tympanic membrane, ear canal and external ear normal.     Mouth/Throat:     Mouth: Mucous membranes are moist.     Pharynx: Oropharynx is clear.  Eyes:     Extraocular Movements: Extraocular movements intact.     Conjunctiva/sclera: Conjunctivae normal.     Pupils: Pupils are equal, round, and reactive to light.  Cardiovascular:     Rate and Rhythm: Normal rate and regular rhythm.     Pulses: Normal pulses.     Heart sounds: Normal heart sounds.  Pulmonary:     Effort: Pulmonary effort is normal.     Breath sounds: Normal breath sounds. No wheezing, rhonchi or rales.  Musculoskeletal:        General: Normal range of motion.     Cervical back: Normal range of motion and neck supple.  Skin:    General: Skin is warm and dry.     Comments: Left outer ear (superior aspect of helix): Erythematous-please see image below  Neurological:     General: No focal deficit present.     Mental Status: She is alert and oriented to person, place, and  time. Mental status is at baseline.  Psychiatric:        Mood and Affect: Mood normal.        Behavior: Behavior normal.      UC Treatments / Results  Labs (all labs ordered are listed, but only abnormal results are displayed) Labs Reviewed - No data to display  EKG   Radiology No results found.  Procedures Procedures (including critical care time)  Medications Ordered in UC Medications - No data to display  Initial Impression / Assessment and Plan / UC Course  I have reviewed the triage vital signs and the nursing notes.  Pertinent labs & imaging results that were available during my care of the patient were reviewed by me and considered in my medical decision making (see chart for details).     MDM: 1.  Cellulitis of helix of left ear: Rx'd Keflex 500 mg capsule: Take 1 capsule twice daily x 7 days. Patient to take medication as directed with food to completion.  Encouraged to increase daily water intake to 64 ounces per day while taking this medication.  Advised/encouraged patient to avoid using topical ointments/creams on this area that may delay wound healing.  Rather allow area to heal by secondary intention/scab formation if necessary.  Advised if symptoms worsen and/or unresolved please follow-up with PCP or here for further evaluation.  Patient discharged home, hemodynamically stable. Final Clinical Impressions(s) / UC Diagnoses   Final diagnoses:  Cellulitis of helix of left ear     Discharge Instructions      Patient to take medication as directed with food to completion.  Encouraged to increase daily water intake to 64 ounces per day while taking this medication.  Advised/encouraged patient to avoid using topical ointments/creams on this area that may delay wound healing.  Rather allow area to heal by secondary intention/scab formation if necessary.  Advised if symptoms worsen and/or unresolved please follow-up with PCP or here for further  evaluation.     ED Prescriptions     Medication Sig Dispense Auth. Provider   cephALEXin (KEFLEX) 500 MG capsule Take 1 capsule (500 mg total) by mouth 2 (two) times daily for 7 days. 14 capsule Trevor Iha, FNP      PDMP not reviewed this encounter.   Trevor Iha, FNP 01/20/23 1825

## 2023-01-20 NOTE — ED Triage Notes (Signed)
Pt c/o swelling and pain to L ear. Pt states she had a pimple on her outer ear that she popped last night. Today has swelling, redness, warmth, and tenderness to L ear. Has been using Mupirocin ointment.

## 2023-01-20 NOTE — Discharge Instructions (Addendum)
Patient to take medication as directed with food to completion.  Encouraged to increase daily water intake to 64 ounces per day while taking this medication.  Advised/encouraged patient to avoid using topical ointments/creams on this area that may delay wound healing.  Rather allow area to heal by secondary intention/scab formation if necessary.  Advised if symptoms worsen and/or unresolved please follow-up with PCP or here for further evaluation.

## 2023-01-23 ENCOUNTER — Other Ambulatory Visit (HOSPITAL_COMMUNITY): Payer: Self-pay

## 2023-01-23 ENCOUNTER — Other Ambulatory Visit: Payer: Self-pay

## 2023-01-26 ENCOUNTER — Other Ambulatory Visit (HOSPITAL_COMMUNITY): Payer: Self-pay

## 2023-02-05 ENCOUNTER — Other Ambulatory Visit: Payer: Self-pay

## 2023-02-05 ENCOUNTER — Ambulatory Visit
Admission: RE | Admit: 2023-02-05 | Discharge: 2023-02-05 | Disposition: A | Discharge status: Home / Self Care | Payer: 59 | Source: Ambulatory Visit | Attending: Internal Medicine | Admitting: Internal Medicine

## 2023-02-05 VITALS — BP 130/84 | HR 95 | Temp 97.9°F | Resp 16

## 2023-02-05 DIAGNOSIS — H6992 Unspecified Eustachian tube disorder, left ear: Secondary | ICD-10-CM | POA: Diagnosis not present

## 2023-02-05 MED ORDER — PREDNISONE 20 MG PO TABS: ORAL_TABLET | ORAL | 0 refills | Status: AC

## 2023-02-05 NOTE — ED Provider Notes (Signed)
[facility]  Note:  This document was prepared using Conservation officer, historic buildings and may include unintentional dictation errors.  MRN: [id number] DOB: May 04, 1965  Subjective:   KENDIA PANCOAST is a 57 y.o. female presenting for 1 week history of acute onset persistent left ear fullness, occasional left ear pain.  Patient was seen and treated for cellulitis of the helix of the left ear with cephalexin.  This did improve those particular symptoms.  No fever, runny or stuffy nose, sore throat, cough.  No ear drainage.  No current facility-administered medications for this encounter.  Current Outpatient Medications:    carisoprodol (SOMA) 350 MG tablet, Take 1 tablet (350 mg total) by mouth every 6 (six) hours as needed for muscle spasms, Disp: 20 tablet, Rfl: 0   COVID-19 mRNA bivalent vaccine, Pfizer, (PFIZER COVID-19 VAC BIVALENT) injection, Inject into the muscle., Disp: 0.3 mL, Rfl: 0   fexofenadine (ALLEGRA) 180 MG tablet, Take 180 mg by mouth daily., Disp: , Rfl:    levonorgestrel-ethinyl estradiol (LYBREL) 90-20 MCG tablet, TAKE 1 TABLET BY MOUTH DAILY, Disp: 84 tablet, Rfl: 4   levonorgestrel-ethinyl estradiol (LYBREL) 90-20 MCG tablet, TAKE 1 TABLET BY MOUTH DAILY, Disp: 84 tablet, Rfl: 4   levonorgestrel-ethinyl estradiol (LYBREL) 90-20 MCG tablet, Take 1 tablet by mouth daily., Disp: 90 tablet, Rfl: 4   mupirocin ointment (BACTROBAN) 2 %, Apply  a small amount to affected area once a day, Disp: 22 g, Rfl: 0   Allergies  Allergen Reactions   Latex Other (See Comments)   Doxycycline Rash    History reviewed. No pertinent past medical history.   History reviewed. No pertinent surgical history.  History reviewed. No pertinent family history.     ROS   Objective:   Vitals: BP 130/84 (BP Location: Left Arm)   Pulse 95   Temp 97.9 F (36.6 C) (Oral)   Resp 16   SpO2 98%   Physical Exam Constitutional:      General: She is not in acute  distress.    Appearance: Normal appearance. She is well-developed. She is not ill-appearing, toxic-appearing or diaphoretic.  HENT:     Head: Normocephalic and atraumatic.     Right Ear: Tympanic membrane, ear canal and external ear normal. No tenderness. There is no impacted cerumen. Tympanic membrane is not injected, perforated, erythematous or bulging.     Left Ear: Tympanic membrane, ear canal and external ear normal. No tenderness. There is no impacted cerumen. Tympanic membrane is not injected, perforated, erythematous or bulging.     Nose: Nose normal.     Mouth/Throat:     Mouth: Mucous membranes are moist.  Eyes:     General: No scleral icterus.       Right eye: No discharge.        Left eye: No discharge.     Extraocular Movements: Extraocular movements intact.  Cardiovascular:     Rate and Rhythm: Normal rate.  Pulmonary:     Effort: Pulmonary effort is normal.  Skin:    General: Skin is warm and dry.  Neurological:     General: No focal deficit present.     Mental Status: She is alert and oriented to person, place, and time.  Psychiatric:        Mood and Affect: Mood normal.        Behavior: Behavior normal.     Assessment and Plan :   PDMP not reviewed this encounter.  1. Eustachian  tube dysfunction, left    Patient has been doing Zyrtec and pseudoephedrine.  Recommended an oral prednisone course to address eustachian tube dysfunction more aggressively.  Will defer antibiotic use given no sign of bacterial otitis media.  Cellulitis of the left helix resolved.  Counseled patient on potential for adverse effects with medications prescribed/recommended today, ER and return-to-clinic precautions discussed, patient verbalized understanding.    Wallis Bamberg, [place] 02/05/23 4403

## 2023-02-05 NOTE — ED Triage Notes (Signed)
Reports fullness in left ear with headache. Was evaluated for same complaint on 10/29, symptoms improved, symptoms returned yesterday.

## 2023-03-04 ENCOUNTER — Ambulatory Visit
Admission: RE | Admit: 2023-03-04 | Discharge: 2023-03-04 | Disposition: A | Discharge status: Home / Self Care | Payer: 59 | Source: Ambulatory Visit | Attending: Family Medicine | Admitting: Family Medicine

## 2023-03-04 VITALS — BP 131/92 | HR 92 | Temp 98.6°F | Resp 17

## 2023-03-04 DIAGNOSIS — L089 Local infection of the skin and subcutaneous tissue, unspecified: Secondary | ICD-10-CM

## 2023-03-04 DIAGNOSIS — B9689 Other specified bacterial agents as the cause of diseases classified elsewhere: Secondary | ICD-10-CM

## 2023-03-04 MED ORDER — SULFAMETHOXAZOLE-TRIMETHOPRIM 800-160 MG PO TABS: 1.0000 | ORAL_TABLET | Freq: Two times a day (BID) | ORAL | 0 refills | Status: AC

## 2023-03-04 NOTE — ED Triage Notes (Signed)
Pt presents with c/o an ingrown toe nail on the lt great toe.  States it looks swollen, red and infected. Has done at home treatments.

## 2023-03-07 NOTE — ED Provider Notes (Signed)
  [facility]   [id number] 03/04/23 Arrival Time: 1637  ASSESSMENT & PLAN:  1. Localized bacterial skin infection    Will treat for infection. Discharge Medication List as of 03/04/2023  5:31 PM     START taking these medications   Details  sulfamethoxazole-trimethoprim (BACTRIM DS) 800-160 MG tablet Take 1 tablet by mouth 2 (two) times daily for 10 days., Starting Wed 03/04/2023, Until Sat 03/14/2023, Normal       Recommend:  Follow-up Information     Schedule an appointment as soon as possible for a visit  with [facility]).   Why: If worsening or failing to improve as anticipated. Contact information: [address] [place],  [place]  [address]  [phone]               Activities as tolerated. Reviewed expectations re: course of current medical issues. Questions answered. Outlined signs and symptoms indicating need for more acute intervention. Patient verbalized understanding. After Visit Summary given.  SUBJECTIVE: History from: patient. [name] [name] is a 57 y.o. female who Pt presents with c/o an ingrown toe nail on the lt great toe.  States it looks swollen, red and infected. Has done at home treatments.  Is ambulatory. No extremity sensation changes or weakness.   History reviewed. No pertinent surgical history.    OBJECTIVE:  Vitals:   03/04/23 1701  BP: (!) 131/92  Pulse: 92  Resp: 17  Temp: 98.6 F (37 C)  TempSrc: Oral  SpO2: 99%    General appearance: alert; no distress HEENT: [place]; AT Neck: supple with FROM Resp: unlabored respirations Extremities: LLE: warm with well perfused appearance; well localized moderate tenderness over left medial nailfold of great toe; without gross deformities; swelling: none; bruising: none; great toe ROM: normal CV: brisk extremity capillary refill of LLE; 2+ DP pulse of LLE. Skin: warm and dry; no visible rashes Neurologic: gait normal; normal sensation and  strength of LLE Psychological: alert and cooperative; normal mood and affect  Imaging: No results found.    Allergies  Allergen Reactions   Latex Other (See Comments)   Doxycycline Rash    History reviewed. No pertinent past medical history. Social History   Socioeconomic History   Marital status: Married    Spouse name: Not on file   Number of children: Not on file   Years of education: Not on file   Highest education level: Not on file  Occupational History   Not on file  Tobacco Use   Smoking status: Never   Smokeless tobacco: Never  Substance and Sexual Activity   Alcohol use: Not on file   Drug use: Not on file   Sexual activity: Not on file  Other Topics Concern   Not on file  Social History Narrative   Not on file   Social Drivers of Health   Financial Resource Strain: Not on file  Food Insecurity: Not on file  Transportation Needs: Not on file  Physical Activity: Not on file  Stress: Not on file  Social Connections: Not on file   History reviewed. No pertinent family history. History reviewed. No pertinent surgical history.     Mardella Layman, MD 03/07/23 [phone]

## 2023-04-14 ENCOUNTER — Other Ambulatory Visit (HOSPITAL_COMMUNITY): Payer: Self-pay

## 2023-04-15 ENCOUNTER — Other Ambulatory Visit (HOSPITAL_COMMUNITY): Payer: Self-pay

## 2023-04-24 ENCOUNTER — Other Ambulatory Visit: Payer: Self-pay

## 2023-07-07 ENCOUNTER — Other Ambulatory Visit (HOSPITAL_COMMUNITY): Payer: Self-pay

## 2023-07-07 MED ORDER — LEVONORGESTREL-ETHINYL ESTRAD 90-20 MCG PO TABS
1.0000 | ORAL_TABLET | Freq: Every day | ORAL | 0 refills | Status: AC
  Filled 2023-07-07: qty 84, 84d supply, fill #0

## 2023-07-30 ENCOUNTER — Other Ambulatory Visit (HOSPITAL_BASED_OUTPATIENT_CLINIC_OR_DEPARTMENT_OTHER): Payer: Self-pay

## 2023-07-30 DIAGNOSIS — Z124 Encounter for screening for malignant neoplasm of cervix: Secondary | ICD-10-CM | POA: Diagnosis not present

## 2023-07-30 DIAGNOSIS — Z01419 Encounter for gynecological examination (general) (routine) without abnormal findings: Secondary | ICD-10-CM | POA: Diagnosis not present

## 2023-07-30 DIAGNOSIS — Z1231 Encounter for screening mammogram for malignant neoplasm of breast: Secondary | ICD-10-CM | POA: Diagnosis not present

## 2023-07-30 DIAGNOSIS — Z6823 Body mass index (BMI) 23.0-23.9, adult: Secondary | ICD-10-CM | POA: Diagnosis not present

## 2023-07-30 DIAGNOSIS — Z13820 Encounter for screening for osteoporosis: Secondary | ICD-10-CM | POA: Diagnosis not present

## 2023-07-30 MED ORDER — LEVONORGESTREL-ETHINYL ESTRAD 90-20 MCG PO TABS
1.0000 | ORAL_TABLET | Freq: Every day | ORAL | 4 refills | Status: AC
  Filled 2023-07-30 – 2023-09-05 (×2): qty 84, 84d supply, fill #0
  Filled 2023-09-05: qty 28, 28d supply, fill #0
  Filled 2023-10-01 (×2): qty 28, 28d supply, fill #1
  Filled 2023-10-24: qty 84, 84d supply, fill #2
  Filled 2024-02-02: qty 84, 84d supply, fill #3
  Filled ????-??-??: fill #0

## 2023-07-31 DIAGNOSIS — Z780 Asymptomatic menopausal state: Secondary | ICD-10-CM | POA: Diagnosis not present

## 2023-07-31 DIAGNOSIS — F411 Generalized anxiety disorder: Secondary | ICD-10-CM | POA: Diagnosis not present

## 2023-07-31 DIAGNOSIS — R002 Palpitations: Secondary | ICD-10-CM | POA: Diagnosis not present

## 2023-07-31 DIAGNOSIS — Z6823 Body mass index (BMI) 23.0-23.9, adult: Secondary | ICD-10-CM | POA: Diagnosis not present

## 2023-08-21 DIAGNOSIS — D2272 Melanocytic nevi of left lower limb, including hip: Secondary | ICD-10-CM | POA: Diagnosis not present

## 2023-08-21 DIAGNOSIS — D2261 Melanocytic nevi of right upper limb, including shoulder: Secondary | ICD-10-CM | POA: Diagnosis not present

## 2023-08-21 DIAGNOSIS — D1801 Hemangioma of skin and subcutaneous tissue: Secondary | ICD-10-CM | POA: Diagnosis not present

## 2023-08-21 DIAGNOSIS — L700 Acne vulgaris: Secondary | ICD-10-CM | POA: Diagnosis not present

## 2023-08-21 DIAGNOSIS — D2262 Melanocytic nevi of left upper limb, including shoulder: Secondary | ICD-10-CM | POA: Diagnosis not present

## 2023-08-21 DIAGNOSIS — L738 Other specified follicular disorders: Secondary | ICD-10-CM | POA: Diagnosis not present

## 2023-08-21 DIAGNOSIS — L821 Other seborrheic keratosis: Secondary | ICD-10-CM | POA: Diagnosis not present

## 2023-08-21 DIAGNOSIS — I788 Other diseases of capillaries: Secondary | ICD-10-CM | POA: Diagnosis not present

## 2023-08-21 DIAGNOSIS — D225 Melanocytic nevi of trunk: Secondary | ICD-10-CM | POA: Diagnosis not present

## 2023-09-05 ENCOUNTER — Other Ambulatory Visit (HOSPITAL_BASED_OUTPATIENT_CLINIC_OR_DEPARTMENT_OTHER): Payer: Self-pay

## 2023-09-07 ENCOUNTER — Other Ambulatory Visit (HOSPITAL_BASED_OUTPATIENT_CLINIC_OR_DEPARTMENT_OTHER): Payer: Self-pay

## 2023-09-07 ENCOUNTER — Other Ambulatory Visit: Payer: Self-pay

## 2023-10-01 ENCOUNTER — Other Ambulatory Visit (HOSPITAL_BASED_OUTPATIENT_CLINIC_OR_DEPARTMENT_OTHER): Payer: Self-pay

## 2023-10-24 ENCOUNTER — Other Ambulatory Visit (HOSPITAL_BASED_OUTPATIENT_CLINIC_OR_DEPARTMENT_OTHER): Payer: Self-pay

## 2023-10-26 ENCOUNTER — Other Ambulatory Visit (HOSPITAL_BASED_OUTPATIENT_CLINIC_OR_DEPARTMENT_OTHER): Payer: Self-pay

## 2023-10-27 ENCOUNTER — Other Ambulatory Visit (HOSPITAL_BASED_OUTPATIENT_CLINIC_OR_DEPARTMENT_OTHER): Payer: Self-pay

## 2023-12-15 DIAGNOSIS — R0989 Other specified symptoms and signs involving the circulatory and respiratory systems: Secondary | ICD-10-CM | POA: Diagnosis not present

## 2023-12-15 DIAGNOSIS — Z13220 Encounter for screening for lipoid disorders: Secondary | ICD-10-CM | POA: Diagnosis not present

## 2023-12-15 DIAGNOSIS — H00012 Hordeolum externum right lower eyelid: Secondary | ICD-10-CM | POA: Diagnosis not present

## 2023-12-15 DIAGNOSIS — E559 Vitamin D deficiency, unspecified: Secondary | ICD-10-CM | POA: Diagnosis not present

## 2023-12-15 DIAGNOSIS — Z6822 Body mass index (BMI) 22.0-22.9, adult: Secondary | ICD-10-CM | POA: Diagnosis not present

## 2023-12-15 DIAGNOSIS — E538 Deficiency of other specified B group vitamins: Secondary | ICD-10-CM | POA: Diagnosis not present

## 2023-12-15 DIAGNOSIS — Z0000 Encounter for general adult medical examination without abnormal findings: Secondary | ICD-10-CM | POA: Diagnosis not present

## 2023-12-15 DIAGNOSIS — F411 Generalized anxiety disorder: Secondary | ICD-10-CM | POA: Diagnosis not present

## 2023-12-30 DIAGNOSIS — K219 Gastro-esophageal reflux disease without esophagitis: Secondary | ICD-10-CM | POA: Diagnosis not present

## 2023-12-30 DIAGNOSIS — R1310 Dysphagia, unspecified: Secondary | ICD-10-CM | POA: Diagnosis not present

## 2024-01-15 DIAGNOSIS — E559 Vitamin D deficiency, unspecified: Secondary | ICD-10-CM | POA: Diagnosis not present

## 2024-01-15 DIAGNOSIS — E538 Deficiency of other specified B group vitamins: Secondary | ICD-10-CM | POA: Diagnosis not present

## 2024-01-15 DIAGNOSIS — Z13220 Encounter for screening for lipoid disorders: Secondary | ICD-10-CM | POA: Diagnosis not present

## 2024-02-02 ENCOUNTER — Other Ambulatory Visit (HOSPITAL_BASED_OUTPATIENT_CLINIC_OR_DEPARTMENT_OTHER): Payer: Self-pay

## 2024-02-02 DIAGNOSIS — S0502XA Injury of conjunctiva and corneal abrasion without foreign body, left eye, initial encounter: Secondary | ICD-10-CM | POA: Diagnosis not present

## 2024-02-03 ENCOUNTER — Other Ambulatory Visit (HOSPITAL_BASED_OUTPATIENT_CLINIC_OR_DEPARTMENT_OTHER): Payer: Self-pay

## 2024-02-03 MED ORDER — NEOMYCIN-POLYMYXIN-DEXAMETH 0.1 % OP SUSP
1.0000 [drp] | Freq: Three times a day (TID) | OPHTHALMIC | 0 refills | Status: AC
  Filled 2024-02-03: qty 5, 7d supply, fill #0

## 2024-02-12 DIAGNOSIS — R1310 Dysphagia, unspecified: Secondary | ICD-10-CM | POA: Diagnosis not present

## 2024-02-12 DIAGNOSIS — K3189 Other diseases of stomach and duodenum: Secondary | ICD-10-CM | POA: Diagnosis not present

## 2024-02-12 DIAGNOSIS — K219 Gastro-esophageal reflux disease without esophagitis: Secondary | ICD-10-CM | POA: Diagnosis not present

## 2024-03-28 ENCOUNTER — Other Ambulatory Visit (HOSPITAL_BASED_OUTPATIENT_CLINIC_OR_DEPARTMENT_OTHER): Payer: Self-pay

## 2024-03-28 MED ORDER — ESTRADIOL 0.05 MG/24HR TD PTTW
MEDICATED_PATCH | TRANSDERMAL | 3 refills | Status: AC
  Filled 2024-03-28: qty 24, 84d supply, fill #0

## 2024-03-28 MED ORDER — PROGESTERONE MICRONIZED 100 MG PO CAPS
100.0000 mg | ORAL_CAPSULE | Freq: Every day | ORAL | 3 refills | Status: AC
  Filled 2024-03-28 (×2): qty 90, 90d supply, fill #0
